# Patient Record
Sex: Female | Born: 1960 | Race: White | Hispanic: No | Marital: Married
Health system: Southern US, Community
[De-identification: ages and names within clinical notes are randomized; demographics above are authoritative.]

## PROBLEM LIST (undated history)

## (undated) DIAGNOSIS — I10 Essential (primary) hypertension: Secondary | ICD-10-CM

## (undated) DIAGNOSIS — J45909 Unspecified asthma, uncomplicated: Secondary | ICD-10-CM

## (undated) DIAGNOSIS — E785 Hyperlipidemia, unspecified: Secondary | ICD-10-CM

---

## 1998-09-17 ENCOUNTER — Other Ambulatory Visit: Admission: RE | Admit: 1998-09-17 | Discharge: 1998-09-17 | Payer: Self-pay | Admitting: Family Medicine

## 1998-10-21 ENCOUNTER — Encounter: Admission: RE | Admit: 1998-10-21 | Discharge: 1998-10-21 | Payer: Self-pay | Admitting: Family Medicine

## 1999-12-14 ENCOUNTER — Other Ambulatory Visit: Admission: RE | Admit: 1999-12-14 | Discharge: 1999-12-14 | Payer: Self-pay | Admitting: Family Medicine

## 1999-12-24 ENCOUNTER — Encounter: Payer: Self-pay | Admitting: Family Medicine

## 1999-12-24 ENCOUNTER — Encounter: Admission: RE | Admit: 1999-12-24 | Discharge: 1999-12-24 | Payer: Self-pay | Admitting: Family Medicine

## 2001-02-26 ENCOUNTER — Other Ambulatory Visit: Admission: RE | Admit: 2001-02-26 | Discharge: 2001-02-26 | Payer: Self-pay | Admitting: Family Medicine

## 2001-03-01 ENCOUNTER — Encounter: Payer: Self-pay | Admitting: Family Medicine

## 2001-03-01 ENCOUNTER — Encounter: Admission: RE | Admit: 2001-03-01 | Discharge: 2001-03-01 | Payer: Self-pay | Admitting: Family Medicine

## 2002-05-23 ENCOUNTER — Other Ambulatory Visit: Admission: RE | Admit: 2002-05-23 | Discharge: 2002-05-23 | Payer: Self-pay | Admitting: Family Medicine

## 2002-08-12 ENCOUNTER — Encounter: Admission: RE | Admit: 2002-08-12 | Discharge: 2002-08-12 | Payer: Self-pay | Admitting: Family Medicine

## 2002-08-12 ENCOUNTER — Encounter: Payer: Self-pay | Admitting: Family Medicine

## 2003-08-25 ENCOUNTER — Encounter: Admission: RE | Admit: 2003-08-25 | Discharge: 2003-08-25 | Payer: Self-pay | Admitting: Family Medicine

## 2003-10-28 ENCOUNTER — Other Ambulatory Visit: Admission: RE | Admit: 2003-10-28 | Discharge: 2003-10-28 | Payer: Self-pay | Admitting: Family Medicine

## 2004-05-03 ENCOUNTER — Ambulatory Visit: Payer: Self-pay | Admitting: Internal Medicine

## 2004-05-10 ENCOUNTER — Encounter: Admission: RE | Admit: 2004-05-10 | Discharge: 2004-05-10 | Payer: Self-pay | Admitting: Family Medicine

## 2004-09-24 ENCOUNTER — Encounter: Admission: RE | Admit: 2004-09-24 | Discharge: 2004-09-24 | Payer: Self-pay | Admitting: Family Medicine

## 2004-11-03 ENCOUNTER — Other Ambulatory Visit: Admission: RE | Admit: 2004-11-03 | Discharge: 2004-11-03 | Payer: Self-pay | Admitting: Family Medicine

## 2005-10-10 ENCOUNTER — Encounter: Admission: RE | Admit: 2005-10-10 | Discharge: 2005-10-10 | Payer: Self-pay | Admitting: Family Medicine

## 2005-11-04 ENCOUNTER — Other Ambulatory Visit: Admission: RE | Admit: 2005-11-04 | Discharge: 2005-11-04 | Payer: Self-pay | Admitting: Family Medicine

## 2005-11-08 ENCOUNTER — Encounter: Admission: RE | Admit: 2005-11-08 | Discharge: 2005-11-08 | Payer: Self-pay | Admitting: Family Medicine

## 2005-11-16 ENCOUNTER — Encounter: Admission: RE | Admit: 2005-11-16 | Discharge: 2005-11-16 | Payer: Self-pay | Admitting: Family Medicine

## 2006-11-08 ENCOUNTER — Other Ambulatory Visit: Admission: RE | Admit: 2006-11-08 | Discharge: 2006-11-08 | Payer: Self-pay | Admitting: Family Medicine

## 2006-11-22 ENCOUNTER — Encounter: Admission: RE | Admit: 2006-11-22 | Discharge: 2006-11-22 | Payer: Self-pay | Admitting: Family Medicine

## 2006-11-29 ENCOUNTER — Encounter: Admission: RE | Admit: 2006-11-29 | Discharge: 2006-11-29 | Payer: Self-pay | Admitting: Family Medicine

## 2007-05-11 ENCOUNTER — Encounter: Admission: RE | Admit: 2007-05-11 | Discharge: 2007-05-11 | Payer: Self-pay | Admitting: Family Medicine

## 2007-12-03 ENCOUNTER — Encounter: Admission: RE | Admit: 2007-12-03 | Discharge: 2007-12-03 | Payer: Self-pay | Admitting: Family Medicine

## 2007-12-18 ENCOUNTER — Other Ambulatory Visit: Admission: RE | Admit: 2007-12-18 | Discharge: 2007-12-18 | Payer: Self-pay | Admitting: Family Medicine

## 2008-12-03 ENCOUNTER — Encounter: Admission: RE | Admit: 2008-12-03 | Discharge: 2008-12-03 | Payer: Self-pay | Admitting: Family Medicine

## 2009-01-28 ENCOUNTER — Other Ambulatory Visit: Admission: RE | Admit: 2009-01-28 | Discharge: 2009-01-28 | Payer: Self-pay | Admitting: Family Medicine

## 2009-02-13 ENCOUNTER — Encounter: Admission: RE | Admit: 2009-02-13 | Discharge: 2009-02-13 | Payer: Self-pay | Admitting: Gastroenterology

## 2009-12-21 ENCOUNTER — Encounter
Admission: RE | Admit: 2009-12-21 | Discharge: 2009-12-21 | Payer: Self-pay | Source: Home / Self Care | Attending: Family Medicine | Admitting: Family Medicine

## 2010-03-01 ENCOUNTER — Other Ambulatory Visit: Payer: Self-pay | Admitting: Family Medicine

## 2010-03-01 ENCOUNTER — Other Ambulatory Visit (HOSPITAL_COMMUNITY)
Admission: RE | Admit: 2010-03-01 | Discharge: 2010-03-01 | Disposition: A | Discharge status: Home / Self Care | Payer: 59 | Source: Ambulatory Visit | Attending: Family Medicine | Admitting: Family Medicine

## 2010-03-01 DIAGNOSIS — Z124 Encounter for screening for malignant neoplasm of cervix: Secondary | ICD-10-CM | POA: Insufficient documentation

## 2010-12-22 ENCOUNTER — Other Ambulatory Visit: Payer: Self-pay | Admitting: Family Medicine

## 2010-12-22 DIAGNOSIS — Z1231 Encounter for screening mammogram for malignant neoplasm of breast: Secondary | ICD-10-CM

## 2010-12-31 ENCOUNTER — Ambulatory Visit: Payer: 59

## 2011-02-16 ENCOUNTER — Ambulatory Visit
Admission: RE | Admit: 2011-02-16 | Discharge: 2011-02-16 | Disposition: A | Discharge status: Home / Self Care | Payer: 59 | Source: Ambulatory Visit | Attending: Family Medicine | Admitting: Family Medicine

## 2011-02-16 DIAGNOSIS — Z1231 Encounter for screening mammogram for malignant neoplasm of breast: Secondary | ICD-10-CM

## 2011-10-06 ENCOUNTER — Other Ambulatory Visit: Payer: Self-pay | Admitting: Family Medicine

## 2011-10-06 DIAGNOSIS — M79609 Pain in unspecified limb: Secondary | ICD-10-CM

## 2011-10-15 ENCOUNTER — Other Ambulatory Visit: Payer: 59

## 2011-10-29 ENCOUNTER — Ambulatory Visit
Admission: RE | Admit: 2011-10-29 | Discharge: 2011-10-29 | Disposition: A | Discharge status: Home / Self Care | Payer: 59 | Source: Ambulatory Visit | Attending: Family Medicine | Admitting: Family Medicine

## 2011-10-29 DIAGNOSIS — M79609 Pain in unspecified limb: Secondary | ICD-10-CM

## 2012-01-25 ENCOUNTER — Other Ambulatory Visit: Payer: Self-pay | Admitting: Family Medicine

## 2012-01-25 DIAGNOSIS — Z1231 Encounter for screening mammogram for malignant neoplasm of breast: Secondary | ICD-10-CM

## 2012-02-22 ENCOUNTER — Ambulatory Visit: Payer: 59

## 2012-03-28 ENCOUNTER — Ambulatory Visit
Admission: RE | Admit: 2012-03-28 | Discharge: 2012-03-28 | Disposition: A | Discharge status: Home / Self Care | Payer: 59 | Source: Ambulatory Visit | Attending: Family Medicine | Admitting: Family Medicine

## 2012-03-28 DIAGNOSIS — Z1231 Encounter for screening mammogram for malignant neoplasm of breast: Secondary | ICD-10-CM

## 2013-02-19 ENCOUNTER — Other Ambulatory Visit: Payer: Self-pay

## 2013-02-19 DIAGNOSIS — Z1231 Encounter for screening mammogram for malignant neoplasm of breast: Secondary | ICD-10-CM

## 2013-03-29 ENCOUNTER — Ambulatory Visit: Payer: 59

## 2013-04-09 ENCOUNTER — Ambulatory Visit: Payer: 59

## 2013-04-24 ENCOUNTER — Ambulatory Visit
Admission: RE | Admit: 2013-04-24 | Discharge: 2013-04-24 | Disposition: A | Discharge status: Home / Self Care | Payer: 59 | Source: Ambulatory Visit

## 2013-04-24 DIAGNOSIS — Z1231 Encounter for screening mammogram for malignant neoplasm of breast: Secondary | ICD-10-CM

## 2013-06-06 ENCOUNTER — Other Ambulatory Visit: Payer: Self-pay | Admitting: Family Medicine

## 2013-06-06 ENCOUNTER — Other Ambulatory Visit (HOSPITAL_COMMUNITY)
Admission: RE | Admit: 2013-06-06 | Discharge: 2013-06-06 | Disposition: A | Discharge status: Home / Self Care | Payer: 59 | Source: Ambulatory Visit | Attending: Family Medicine | Admitting: Family Medicine

## 2013-06-06 DIAGNOSIS — Z01419 Encounter for gynecological examination (general) (routine) without abnormal findings: Secondary | ICD-10-CM | POA: Insufficient documentation

## 2014-11-05 ENCOUNTER — Other Ambulatory Visit: Payer: Self-pay

## 2014-11-05 DIAGNOSIS — Z1231 Encounter for screening mammogram for malignant neoplasm of breast: Secondary | ICD-10-CM

## 2014-12-03 ENCOUNTER — Ambulatory Visit
Admission: RE | Admit: 2014-12-03 | Discharge: 2014-12-03 | Disposition: A | Discharge status: Home / Self Care | Payer: 59 | Source: Ambulatory Visit

## 2014-12-03 DIAGNOSIS — Z1231 Encounter for screening mammogram for malignant neoplasm of breast: Secondary | ICD-10-CM

## 2016-04-06 DIAGNOSIS — E7800 Pure hypercholesterolemia, unspecified: Secondary | ICD-10-CM | POA: Diagnosis not present

## 2016-04-06 DIAGNOSIS — I10 Essential (primary) hypertension: Secondary | ICD-10-CM | POA: Diagnosis not present

## 2016-04-06 DIAGNOSIS — R1013 Epigastric pain: Secondary | ICD-10-CM | POA: Diagnosis not present

## 2016-04-27 DIAGNOSIS — M25561 Pain in right knee: Secondary | ICD-10-CM | POA: Diagnosis not present

## 2016-05-05 DIAGNOSIS — M79604 Pain in right leg: Secondary | ICD-10-CM | POA: Diagnosis not present

## 2016-05-17 DIAGNOSIS — M79604 Pain in right leg: Secondary | ICD-10-CM | POA: Diagnosis not present

## 2016-05-25 DIAGNOSIS — M79604 Pain in right leg: Secondary | ICD-10-CM | POA: Diagnosis not present

## 2016-06-01 DIAGNOSIS — M79604 Pain in right leg: Secondary | ICD-10-CM | POA: Diagnosis not present

## 2016-11-03 ENCOUNTER — Other Ambulatory Visit: Payer: Self-pay | Admitting: Family Medicine

## 2016-11-03 ENCOUNTER — Other Ambulatory Visit (HOSPITAL_COMMUNITY)
Admission: RE | Admit: 2016-11-03 | Discharge: 2016-11-03 | Disposition: A | Discharge status: Home / Self Care | Payer: 59 | Source: Ambulatory Visit | Attending: Family Medicine | Admitting: Family Medicine

## 2016-11-03 DIAGNOSIS — Z0000 Encounter for general adult medical examination without abnormal findings: Secondary | ICD-10-CM | POA: Diagnosis not present

## 2016-11-03 DIAGNOSIS — I10 Essential (primary) hypertension: Secondary | ICD-10-CM | POA: Diagnosis not present

## 2016-11-03 DIAGNOSIS — E7800 Pure hypercholesterolemia, unspecified: Secondary | ICD-10-CM | POA: Diagnosis not present

## 2016-11-03 DIAGNOSIS — Z01419 Encounter for gynecological examination (general) (routine) without abnormal findings: Secondary | ICD-10-CM | POA: Insufficient documentation

## 2016-11-04 ENCOUNTER — Other Ambulatory Visit: Payer: Self-pay | Admitting: Family Medicine

## 2016-11-04 DIAGNOSIS — Z1231 Encounter for screening mammogram for malignant neoplasm of breast: Secondary | ICD-10-CM

## 2016-11-04 LAB — CYTOLOGY - PAP: Diagnosis: NEGATIVE

## 2016-11-29 ENCOUNTER — Ambulatory Visit
Admission: RE | Admit: 2016-11-29 | Discharge: 2016-11-29 | Disposition: A | Discharge status: Home / Self Care | Payer: 59 | Source: Ambulatory Visit | Attending: Family Medicine | Admitting: Family Medicine

## 2016-11-29 DIAGNOSIS — Z1231 Encounter for screening mammogram for malignant neoplasm of breast: Secondary | ICD-10-CM | POA: Diagnosis not present

## 2017-05-31 DIAGNOSIS — E7800 Pure hypercholesterolemia, unspecified: Secondary | ICD-10-CM | POA: Diagnosis not present

## 2017-05-31 DIAGNOSIS — I10 Essential (primary) hypertension: Secondary | ICD-10-CM | POA: Diagnosis not present

## 2017-05-31 DIAGNOSIS — K219 Gastro-esophageal reflux disease without esophagitis: Secondary | ICD-10-CM | POA: Diagnosis not present

## 2017-08-14 DIAGNOSIS — R42 Dizziness and giddiness: Secondary | ICD-10-CM | POA: Diagnosis not present

## 2017-10-03 DIAGNOSIS — R42 Dizziness and giddiness: Secondary | ICD-10-CM | POA: Diagnosis not present

## 2017-10-11 DIAGNOSIS — R42 Dizziness and giddiness: Secondary | ICD-10-CM | POA: Diagnosis not present

## 2017-10-18 ENCOUNTER — Other Ambulatory Visit: Payer: Self-pay | Admitting: Family Medicine

## 2017-10-18 DIAGNOSIS — Z1231 Encounter for screening mammogram for malignant neoplasm of breast: Secondary | ICD-10-CM

## 2017-12-04 ENCOUNTER — Ambulatory Visit
Admission: RE | Admit: 2017-12-04 | Discharge: 2017-12-04 | Disposition: A | Discharge status: Home / Self Care | Payer: 59 | Source: Ambulatory Visit | Attending: Family Medicine | Admitting: Family Medicine

## 2017-12-04 DIAGNOSIS — Z1231 Encounter for screening mammogram for malignant neoplasm of breast: Secondary | ICD-10-CM

## 2017-12-15 DIAGNOSIS — Z0000 Encounter for general adult medical examination without abnormal findings: Secondary | ICD-10-CM | POA: Diagnosis not present

## 2017-12-15 DIAGNOSIS — I10 Essential (primary) hypertension: Secondary | ICD-10-CM | POA: Diagnosis not present

## 2017-12-15 DIAGNOSIS — E7800 Pure hypercholesterolemia, unspecified: Secondary | ICD-10-CM | POA: Diagnosis not present

## 2017-12-29 DIAGNOSIS — S46811A Strain of other muscles, fascia and tendons at shoulder and upper arm level, right arm, initial encounter: Secondary | ICD-10-CM | POA: Diagnosis not present

## 2018-01-09 DIAGNOSIS — S46911D Strain of unspecified muscle, fascia and tendon at shoulder and upper arm level, right arm, subsequent encounter: Secondary | ICD-10-CM | POA: Diagnosis not present

## 2018-01-09 DIAGNOSIS — M6281 Muscle weakness (generalized): Secondary | ICD-10-CM | POA: Diagnosis not present

## 2019-01-15 ENCOUNTER — Other Ambulatory Visit: Payer: Self-pay | Admitting: Family Medicine

## 2019-01-15 DIAGNOSIS — Z1231 Encounter for screening mammogram for malignant neoplasm of breast: Secondary | ICD-10-CM

## 2019-01-22 ENCOUNTER — Other Ambulatory Visit: Payer: Self-pay

## 2019-01-22 ENCOUNTER — Ambulatory Visit
Admission: RE | Admit: 2019-01-22 | Discharge: 2019-01-22 | Disposition: A | Discharge status: Home / Self Care | Payer: BLUE CROSS/BLUE SHIELD | Source: Ambulatory Visit | Attending: Family Medicine | Admitting: Family Medicine

## 2019-01-22 DIAGNOSIS — Z1231 Encounter for screening mammogram for malignant neoplasm of breast: Secondary | ICD-10-CM | POA: Diagnosis not present

## 2019-04-28 DIAGNOSIS — Z03818 Encounter for observation for suspected exposure to other biological agents ruled out: Secondary | ICD-10-CM | POA: Diagnosis not present

## 2019-06-21 DIAGNOSIS — Z0000 Encounter for general adult medical examination without abnormal findings: Secondary | ICD-10-CM | POA: Diagnosis not present

## 2019-06-21 DIAGNOSIS — E7800 Pure hypercholesterolemia, unspecified: Secondary | ICD-10-CM | POA: Diagnosis not present

## 2019-06-21 DIAGNOSIS — Z23 Encounter for immunization: Secondary | ICD-10-CM | POA: Diagnosis not present

## 2019-06-21 DIAGNOSIS — I10 Essential (primary) hypertension: Secondary | ICD-10-CM | POA: Diagnosis not present

## 2019-08-22 DIAGNOSIS — Z23 Encounter for immunization: Secondary | ICD-10-CM | POA: Diagnosis not present

## 2020-01-17 ENCOUNTER — Other Ambulatory Visit: Payer: Self-pay | Admitting: Family Medicine

## 2020-01-17 DIAGNOSIS — Z1231 Encounter for screening mammogram for malignant neoplasm of breast: Secondary | ICD-10-CM

## 2020-02-27 ENCOUNTER — Ambulatory Visit
Admission: RE | Admit: 2020-02-27 | Discharge: 2020-02-27 | Disposition: A | Discharge status: Home / Self Care | Payer: BLUE CROSS/BLUE SHIELD | Source: Ambulatory Visit | Attending: Family Medicine | Admitting: Family Medicine

## 2020-02-27 ENCOUNTER — Other Ambulatory Visit: Payer: Self-pay

## 2020-02-27 DIAGNOSIS — Z1231 Encounter for screening mammogram for malignant neoplasm of breast: Secondary | ICD-10-CM | POA: Diagnosis not present

## 2020-06-22 DIAGNOSIS — Z124 Encounter for screening for malignant neoplasm of cervix: Secondary | ICD-10-CM | POA: Diagnosis not present

## 2020-06-22 DIAGNOSIS — Z0000 Encounter for general adult medical examination without abnormal findings: Secondary | ICD-10-CM | POA: Diagnosis not present

## 2020-06-22 DIAGNOSIS — I10 Essential (primary) hypertension: Secondary | ICD-10-CM | POA: Diagnosis not present

## 2020-06-22 DIAGNOSIS — E7800 Pure hypercholesterolemia, unspecified: Secondary | ICD-10-CM | POA: Diagnosis not present

## 2020-10-24 DIAGNOSIS — Z23 Encounter for immunization: Secondary | ICD-10-CM | POA: Diagnosis not present

## 2021-02-04 ENCOUNTER — Other Ambulatory Visit: Payer: Self-pay | Admitting: Family Medicine

## 2021-02-04 DIAGNOSIS — Z1231 Encounter for screening mammogram for malignant neoplasm of breast: Secondary | ICD-10-CM

## 2021-03-01 ENCOUNTER — Other Ambulatory Visit: Payer: Self-pay

## 2021-03-01 ENCOUNTER — Ambulatory Visit
Admission: RE | Admit: 2021-03-01 | Discharge: 2021-03-01 | Disposition: A | Discharge status: Home / Self Care | Payer: BLUE CROSS/BLUE SHIELD | Source: Ambulatory Visit | Attending: Family Medicine | Admitting: Family Medicine

## 2021-03-01 DIAGNOSIS — Z1231 Encounter for screening mammogram for malignant neoplasm of breast: Secondary | ICD-10-CM | POA: Diagnosis not present

## 2021-04-14 DIAGNOSIS — L814 Other melanin hyperpigmentation: Secondary | ICD-10-CM | POA: Diagnosis not present

## 2021-04-14 DIAGNOSIS — D2239 Melanocytic nevi of other parts of face: Secondary | ICD-10-CM | POA: Diagnosis not present

## 2021-04-14 DIAGNOSIS — L570 Actinic keratosis: Secondary | ICD-10-CM | POA: Diagnosis not present

## 2021-04-14 DIAGNOSIS — D225 Melanocytic nevi of trunk: Secondary | ICD-10-CM | POA: Diagnosis not present

## 2021-04-14 DIAGNOSIS — L820 Inflamed seborrheic keratosis: Secondary | ICD-10-CM | POA: Diagnosis not present

## 2021-06-03 ENCOUNTER — Other Ambulatory Visit: Payer: Self-pay

## 2021-06-03 ENCOUNTER — Emergency Department (HOSPITAL_BASED_OUTPATIENT_CLINIC_OR_DEPARTMENT_OTHER)
Admission: EM | Admit: 2021-06-03 | Discharge: 2021-06-03 | Disposition: A | Discharge status: Home / Self Care | Payer: BLUE CROSS/BLUE SHIELD | Attending: Emergency Medicine | Admitting: Emergency Medicine

## 2021-06-03 ENCOUNTER — Encounter (HOSPITAL_BASED_OUTPATIENT_CLINIC_OR_DEPARTMENT_OTHER): Payer: Self-pay | Admitting: Emergency Medicine

## 2021-06-03 ENCOUNTER — Emergency Department (HOSPITAL_BASED_OUTPATIENT_CLINIC_OR_DEPARTMENT_OTHER): Payer: BLUE CROSS/BLUE SHIELD

## 2021-06-03 DIAGNOSIS — R040 Epistaxis: Secondary | ICD-10-CM | POA: Diagnosis not present

## 2021-06-03 DIAGNOSIS — J45909 Unspecified asthma, uncomplicated: Secondary | ICD-10-CM | POA: Insufficient documentation

## 2021-06-03 DIAGNOSIS — R030 Elevated blood-pressure reading, without diagnosis of hypertension: Secondary | ICD-10-CM | POA: Diagnosis not present

## 2021-06-03 DIAGNOSIS — I10 Essential (primary) hypertension: Secondary | ICD-10-CM | POA: Insufficient documentation

## 2021-06-03 DIAGNOSIS — R42 Dizziness and giddiness: Secondary | ICD-10-CM | POA: Diagnosis not present

## 2021-06-03 HISTORY — DX: Hyperlipidemia, unspecified: E78.5

## 2021-06-03 HISTORY — DX: Unspecified asthma, uncomplicated: J45.909

## 2021-06-03 HISTORY — DX: Essential (primary) hypertension: I10

## 2021-06-03 LAB — CBC
HCT: 42.9 % (ref 36.0–46.0)
Hemoglobin: 14.5 g/dL (ref 12.0–15.0)
MCH: 32.2 pg (ref 26.0–34.0)
MCHC: 33.8 g/dL (ref 30.0–36.0)
MCV: 95.1 fL (ref 80.0–100.0)
Platelets: 341 10*3/uL (ref 150–400)
RBC: 4.51 MIL/uL (ref 3.87–5.11)
RDW: 13.1 % (ref 11.5–15.5)
WBC: 10.7 10*3/uL — ABNORMAL HIGH (ref 4.0–10.5)
nRBC: 0 % (ref 0.0–0.2)

## 2021-06-03 LAB — BASIC METABOLIC PANEL
Anion gap: 8 (ref 5–15)
BUN: 13 mg/dL (ref 8–23)
CO2: 27 mmol/L (ref 22–32)
Calcium: 9.1 mg/dL (ref 8.9–10.3)
Chloride: 104 mmol/L (ref 98–111)
Creatinine, Ser: 0.8 mg/dL (ref 0.44–1.00)
GFR, Estimated: >60 mL/min (ref >60)
Glucose, Bld: 109 mg/dL — ABNORMAL HIGH (ref 70–99)
Potassium: 3.5 mmol/L (ref 3.5–5.1)
Sodium: 139 mmol/L (ref 135–145)

## 2021-06-03 LAB — URINALYSIS, MICROSCOPIC (REFLEX): WBC, UA: NONE SEEN WBC/hpf (ref 0–5)

## 2021-06-03 LAB — URINALYSIS, ROUTINE W REFLEX MICROSCOPIC
Bilirubin Urine: NEGATIVE
Glucose, UA: NEGATIVE mg/dL
Ketones, ur: NEGATIVE mg/dL
Leukocytes,Ua: NEGATIVE
Nitrite: NEGATIVE
Protein, ur: NEGATIVE mg/dL
Specific Gravity, Urine: 1.015 (ref 1.005–1.030)
pH: 7 (ref 5.0–8.0)

## 2021-06-03 LAB — CBG MONITORING, ED: Glucose-Capillary: 108 mg/dL — ABNORMAL HIGH (ref 70–99)

## 2021-06-03 NOTE — Discharge Instructions (Signed)
We discussed, we recommend keeping your nostrils moisturized with Vaseline.  You can do this 1-2 times per day.  If you find that you have a recurrent nosebleed, please use the Afrin nasal spray and sterile gauze like you were instructed.  Below is the contact information for [facility]. If you find that you are continuing to get recurrent nosebleeds, please give them a call and schedule and appointment for reevaluation.  If you develop any new or worsening symptoms, please come back to the emergency department. It was a pleasure to meet you.

## 2021-06-03 NOTE — ED Provider Notes (Signed)
[facility] EMERGENCY DEPARTMENT Provider Note   CSN: [id number] Arrival date & time: 06/03/21  1439     History  Chief Complaint  Patient presents with   Epistaxis    Monica Hayes is a 61 y.o. female.  HPI Patient is a 61 year old female with hypertension, hyperlipidemia, asthma who presents to the emergency department due to recurrent nosebleeds.  Patient states that she was at the beach about 1.5 weeks ago.  Since coming home from the beach she began having nosebleeds.  She states that they have occurred about 4 times in the past week.  Most recent was this morning.  They last about 5 to 20 minutes and then will spontaneously resolve.  She does note that she has a history of vertigo and at times becomes dizzy.  She has noticed that after having the nosebleed she will have a brief episode of feeling "off balance" but denies any persistent dizziness or symptoms similar to the "room spinning".  Denies any chest pain, shortness of breath, visual changes, syncope.  She takes 100 mg of losartan for her high blood pressure and has been compliant with this medication.  She has noticed that her blood pressure has been higher than normal over the past week.  She does note that while at the beach she was eating more fried foods and drinking more alcohol than normal.    Home Medications Prior to Admission medications   Not on File      Allergies    Atorvastatin, Penicillins, Rosuvastatin, and Sulfa antibiotics    Review of Systems   Review of Systems  All other systems reviewed and are negative. Ten systems reviewed and are negative for acute change, except as noted in the HPI.   Physical Exam Updated Vital Signs BP (!) 178/86   Pulse 93   Temp 98.1 F (36.7 C) (Oral)   Resp 18   Ht 4' 10.5" (1.486 m)   Wt 63.5 kg   SpO2 97%   BMI 28.76 kg/m  Physical Exam Vitals and nursing note reviewed.  Constitutional:      General: She is not in acute distress.    Appearance:  Normal appearance. She is not ill-appearing, toxic-appearing or diaphoretic.  HENT:     Head: Normocephalic and atraumatic.     Right Ear: External ear normal.     Left Ear: External ear normal.     Nose: Nose normal.     Comments: No bleeding noted from the nares.     Mouth/Throat:     Mouth: Mucous membranes are moist.     Pharynx: Oropharynx is clear. No oropharyngeal exudate or posterior oropharyngeal erythema.  Eyes:     General: No scleral icterus.       Right eye: No discharge.        Left eye: No discharge.     Extraocular Movements: Extraocular movements intact.     Conjunctiva/sclera: Conjunctivae normal.     Pupils: Pupils are equal, round, and reactive to light.     Comments: PERRL. EOMI.  Cardiovascular:     Rate and Rhythm: Normal rate and regular rhythm.     Pulses: Normal pulses.     Heart sounds: Normal heart sounds. No murmur heard.   No friction rub. No gallop.  Pulmonary:     Effort: Pulmonary effort is normal. No respiratory distress.     Breath sounds: Normal breath sounds. No stridor. No wheezing, rhonchi or rales.  Abdominal:  General: Abdomen is flat.     Palpations: Abdomen is soft.     Tenderness: There is no abdominal tenderness.  Musculoskeletal:        General: Normal range of motion.     Cervical back: Normal range of motion and neck supple. No tenderness.     Right lower leg: No edema.     Left lower leg: No edema.  Skin:    General: Skin is warm and dry.  Neurological:     General: No focal deficit present.     Mental Status: She is alert and oriented to person, place, and time.     Comments: Patient is oriented to person, place, and time. Patient phonates in clear, complete, and coherent sentences.  No facial droop.  Strength is 5/5 in all four extremities. Distal sensation intact in all four extremities.  Psychiatric:        Mood and Affect: Mood normal.        Behavior: Behavior normal.   ED Results / Procedures / Treatments    Labs (all labs ordered are listed, but only abnormal results are displayed) Labs Reviewed  BASIC METABOLIC PANEL - Abnormal; Notable for the following components:      Result Value   Glucose, Bld 109 (*)    All other components within normal limits  CBC - Abnormal; Notable for the following components:   WBC 10.7 (*)    All other components within normal limits  URINALYSIS, ROUTINE W REFLEX MICROSCOPIC - Abnormal; Notable for the following components:   Hgb urine dipstick TRACE (*)    All other components within normal limits  URINALYSIS, MICROSCOPIC (REFLEX) - Abnormal; Notable for the following components:   Bacteria, UA RARE (*)    All other components within normal limits  CBG MONITORING, ED - Abnormal; Notable for the following components:   Glucose-Capillary 108 (*)    All other components within normal limits   EKG EKG Interpretation  Date/Time:  Thursday Jun 03 2021 14:47:55 EDT Ventricular Rate:  87 PR Interval:  132 QRS Duration: 78 QT Interval:  370 QTC Calculation: 445 R Axis:   13 Text Interpretation: Normal sinus rhythm Cannot rule out Anterior infarct , age undetermined Abnormal ECG No previous ECGs available no prior ECG for comparison NO sTEMI Confirmed by Antony Blackbird [phone]) on 06/03/2021 5:18:48 PM  Radiology CT Head Wo Contrast  Result Date: 06/03/2021 CLINICAL DATA:  Dizziness Simona Huh, nonspecific. EXAM: CT HEAD WITHOUT CONTRAST TECHNIQUE: Contiguous axial images were obtained from the base of the skull through the vertex without intravenous contrast. RADIATION DOSE REDUCTION: This exam was performed according to the departmental dose-optimization program which includes automated exposure control, adjustment of the mA and/or kV according to patient size and/or use of iterative reconstruction technique. COMPARISON:  None FINDINGS: Brain: The brain shows a normal appearance without evidence of malformation, atrophy, old or acute small or large vessel  infarction, mass lesion, hemorrhage, hydrocephalus or extra-axial collection. Vascular: No hyperdense vessel. No evidence of atherosclerotic calcification. Skull: Normal.  No traumatic finding.  No focal bone lesion. Sinuses/Orbits: Sinuses are clear. Orbits appear normal. Mastoids are clear. Other: None significant IMPRESSION: Normal head CT. Electronically Signed   By: Nelson Chimes M.D.   On: 06/03/2021 16:19    Procedures Procedures   Medications Ordered in ED Medications - No data to display  ED Course/ Medical Decision Making/ A&P  Medical Decision Making Amount and/or Complexity of Data Reviewed Labs: ordered. Radiology: ordered.  Pt is a 61 y.o. female who presents to the emergency department due to 4 episodes of epistaxis that occurred over the past week.  She notes that she recently got back from the beach prior to the onset of these episodes.  Patient also notes elevated blood pressure over the past week.  She states that she was also eating a large amount of salty food and drinking more alcohol than normal while at the beach prior to the onset of her elevated blood pressure.  Labs: CBC with a white count of 10.7. BMP with a glucose of 109. UA with trace hemoglobin and rare bacteria.  Imaging: CT scan of the head without contrast shows a "normal head CT."  I, Rayna Sexton, PA-C, personally reviewed and evaluated these images and lab results as part of my medical decision-making.  Patient's physical exam appears reassuring.  No active bleeding noted from the nares.  Heart is regular rate and rhythm without murmurs, rubs, or gallops.  Lungs are clear to auscultation bilaterally.  Neurological exam appears benign at this time.  Patient did note that she has a history of vertigo and after her nosebleeds she has had brief episodes of "feeling off balance".  Denies any sensations of the room spinning.  I obtained a CT scan of her head which shows a "normal  head CT".  She also notes that her blood pressures been more elevated than normal over the past week.  She has been compliant with her losartan.  Current blood pressure is 178/86.  Denies any chest pain, shortness of breath, vision changes, numbness, weakness.  Lab work appears reassuring.  Normal kidney function.  No proteinuria.  No signs of endorgan damage noted.  Doubt hypertensive emergency at this time. Recommended that she continue to monitor her BP at home and follow up with her PCP regarding this.  Patient lying comfortably in bed.  She appears stable for discharge at this time and she is agreeable.  Recommended Afrin use as needed if she finds that she has another episode of epistaxis.  Patient was given a referral to ENT.  She understands to return to the emergency department if she finds her symptoms are refractory.  Her questions were answered and she was amicable at the time of discharge.  Note: Portions of this report may have been transcribed using voice recognition software. Every effort was made to ensure accuracy; however, inadvertent computerized transcription errors may be present.   Final Clinical Impression(s) / ED Diagnoses Final diagnoses:  Epistaxis   Rx / DC Orders ED Discharge Orders     None         Rayna Sexton, PA-C 06/03/21 1800    Tegeler, Gwenyth Allegra, MD 06/03/21 2311

## 2021-06-03 NOTE — ED Triage Notes (Signed)
Intermittent epistaxis x 1 week , Hx HTN, no blood thinner .  Sleeps with Ac and Edison Simon on .  Reports dull headache with epistaxis.  Dizziness this am , intermittent dizziness x 2 weeks .  Ambulatory , denies further neuro deficit

## 2021-06-08 DIAGNOSIS — I10 Essential (primary) hypertension: Secondary | ICD-10-CM | POA: Diagnosis not present

## 2021-06-08 DIAGNOSIS — R040 Epistaxis: Secondary | ICD-10-CM | POA: Diagnosis not present

## 2021-06-24 DIAGNOSIS — I10 Essential (primary) hypertension: Secondary | ICD-10-CM | POA: Diagnosis not present

## 2021-06-24 DIAGNOSIS — Z0000 Encounter for general adult medical examination without abnormal findings: Secondary | ICD-10-CM | POA: Diagnosis not present

## 2021-06-24 DIAGNOSIS — E7800 Pure hypercholesterolemia, unspecified: Secondary | ICD-10-CM | POA: Diagnosis not present

## 2021-06-25 DIAGNOSIS — J310 Chronic rhinitis: Secondary | ICD-10-CM | POA: Diagnosis not present

## 2021-06-25 DIAGNOSIS — Z87898 Personal history of other specified conditions: Secondary | ICD-10-CM | POA: Diagnosis not present

## 2021-06-25 DIAGNOSIS — J342 Deviated nasal septum: Secondary | ICD-10-CM | POA: Diagnosis not present

## 2022-02-18 ENCOUNTER — Other Ambulatory Visit: Payer: Self-pay | Admitting: Family Medicine

## 2022-02-18 DIAGNOSIS — Z1231 Encounter for screening mammogram for malignant neoplasm of breast: Secondary | ICD-10-CM

## 2022-04-07 ENCOUNTER — Ambulatory Visit
Admission: RE | Admit: 2022-04-07 | Discharge: 2022-04-07 | Disposition: A | Discharge status: Home / Self Care | Payer: BLUE CROSS/BLUE SHIELD | Source: Ambulatory Visit | Attending: Family Medicine | Admitting: Family Medicine

## 2022-04-07 DIAGNOSIS — Z1231 Encounter for screening mammogram for malignant neoplasm of breast: Secondary | ICD-10-CM

## 2022-06-27 DIAGNOSIS — E7800 Pure hypercholesterolemia, unspecified: Secondary | ICD-10-CM | POA: Diagnosis not present

## 2022-06-27 DIAGNOSIS — Z0000 Encounter for general adult medical examination without abnormal findings: Secondary | ICD-10-CM | POA: Diagnosis not present

## 2022-06-27 DIAGNOSIS — I10 Essential (primary) hypertension: Secondary | ICD-10-CM | POA: Diagnosis not present

## 2022-11-04 DIAGNOSIS — R002 Palpitations: Secondary | ICD-10-CM | POA: Diagnosis not present

## 2022-11-28 DIAGNOSIS — R002 Palpitations: Secondary | ICD-10-CM | POA: Diagnosis not present

## 2023-04-04 IMAGING — CT CT HEAD W/O CM
3 series · 14 of 47 positions shown, 16 images · non-contrast
Comparison: None

CLINICAL DATA: Dizziness Aujla, nonspecific.



[Series 2: head wo · axial · 0.44mm/px · z∈[+627,+752]mm · 8 of 30 slices shown, 10 images]
[im 3/30  brain]
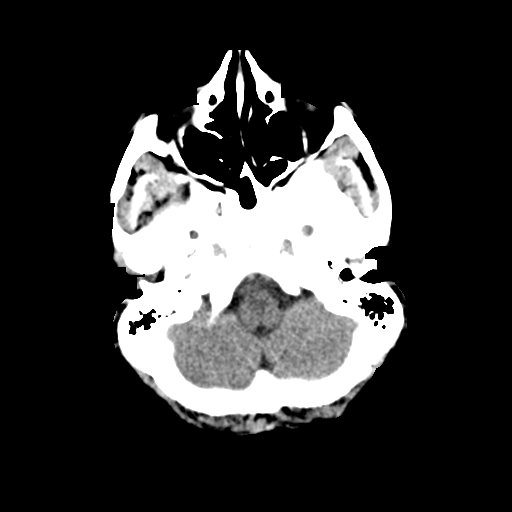
[im 3/30  bone]
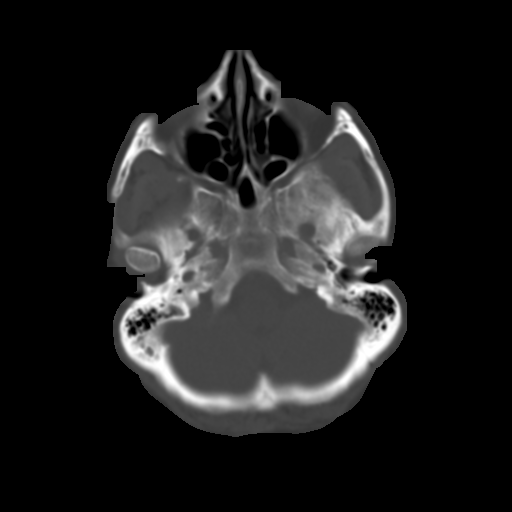
[im 7/30  brain]
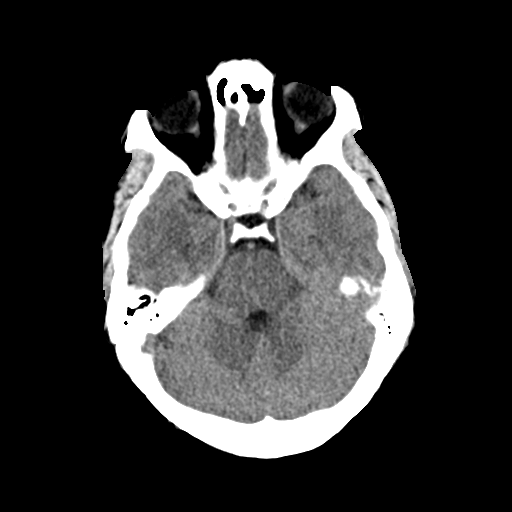
[im 10/30  brain]
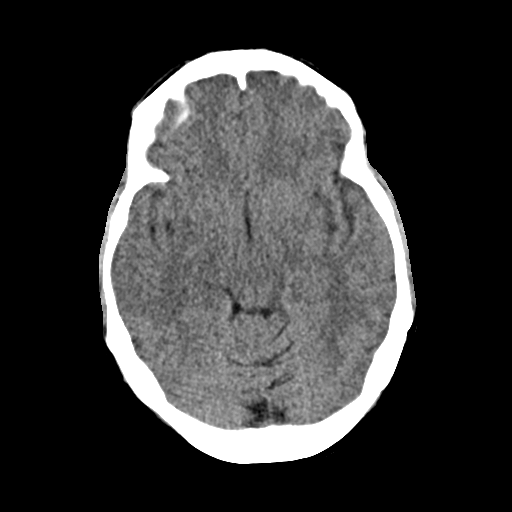
[im 14/30  brain]
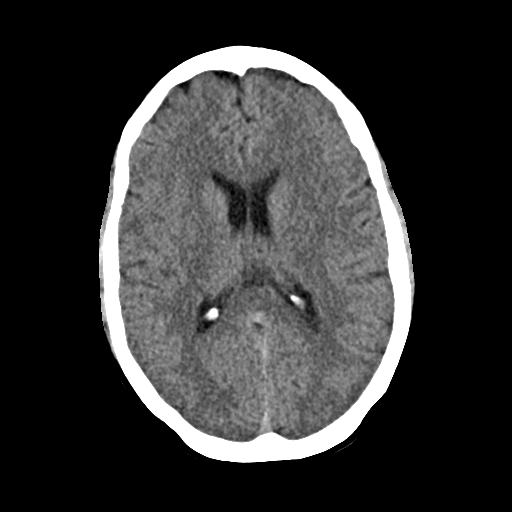
[im 17/30  brain]
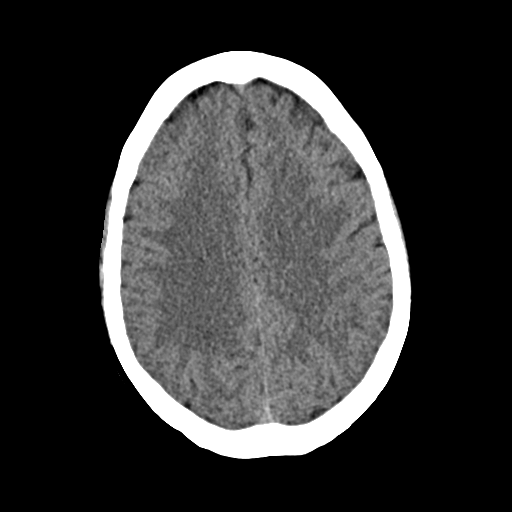
[im 17/30  bone]
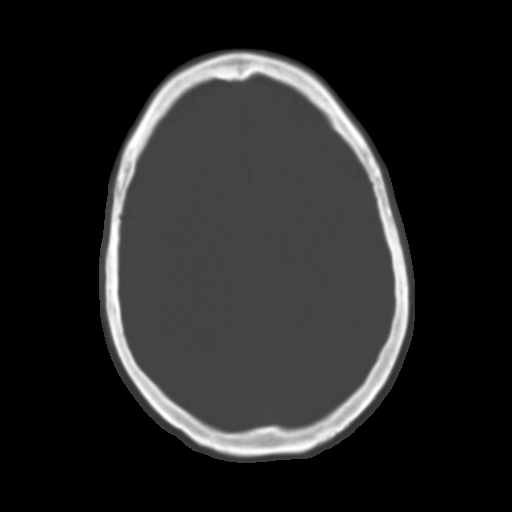
[im 21/30  brain]
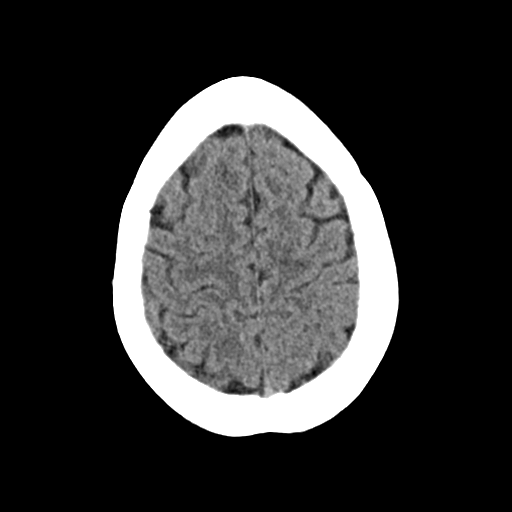
[im 24/30  brain]
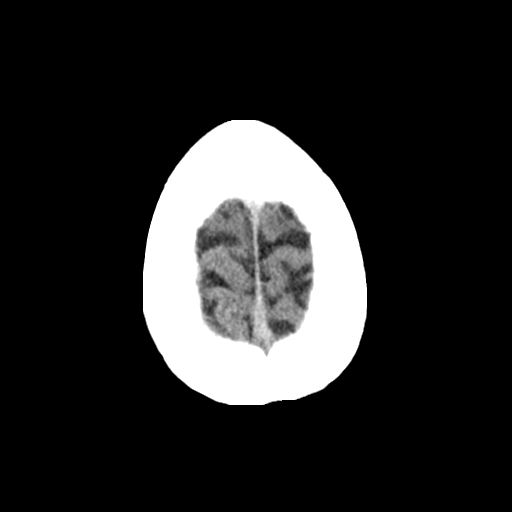
[im 28/30  brain]
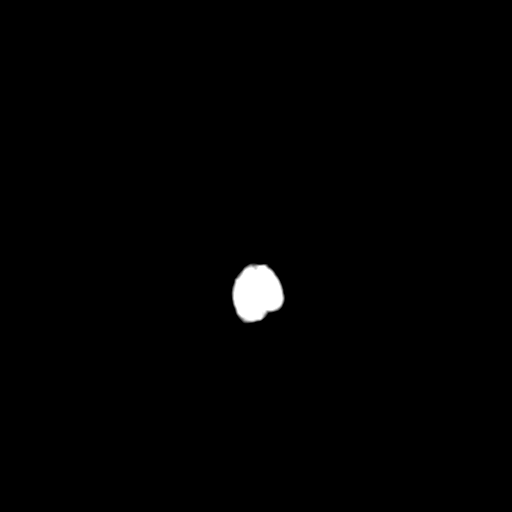

[Series 4: coronal soft · coronal · 0.28mm/px · 3 of 67 slices shown]
[im 23/67  brain]
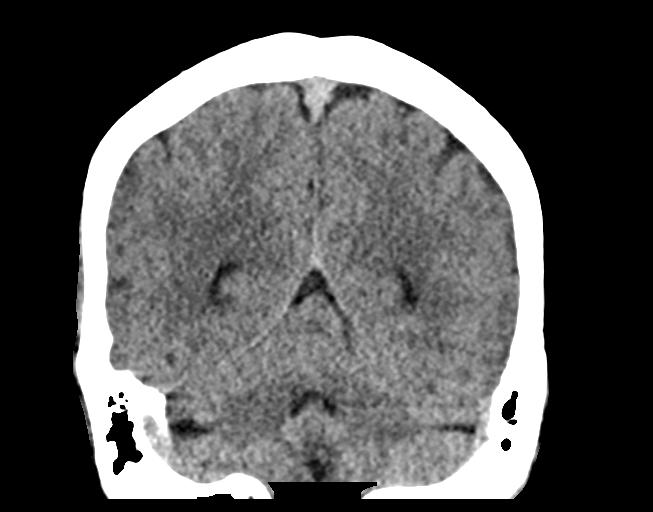
[im 30/67  brain]
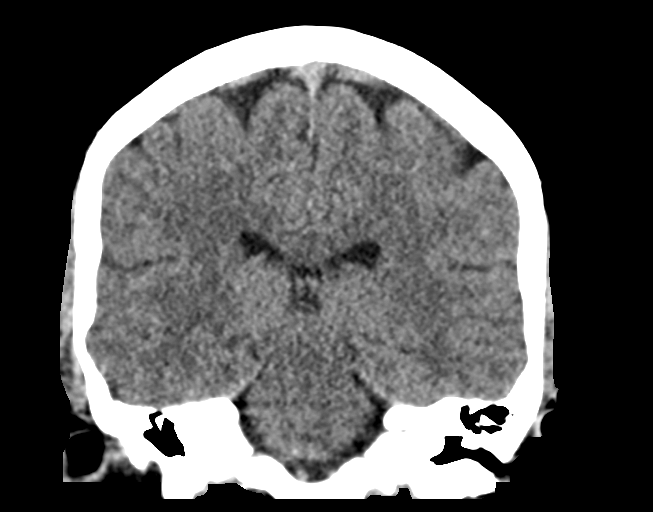
[im 37/67  brain]
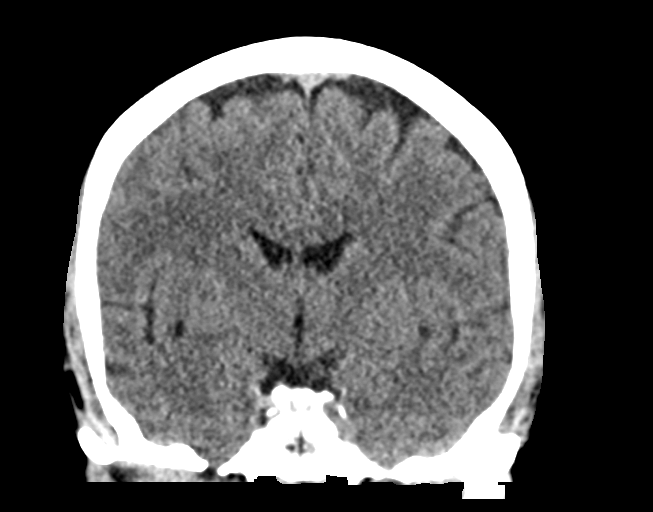

[Series 5: sag soft · sagittal · 0.28mm/px · 3 of 67 slices shown]
[im 23/67  brain]
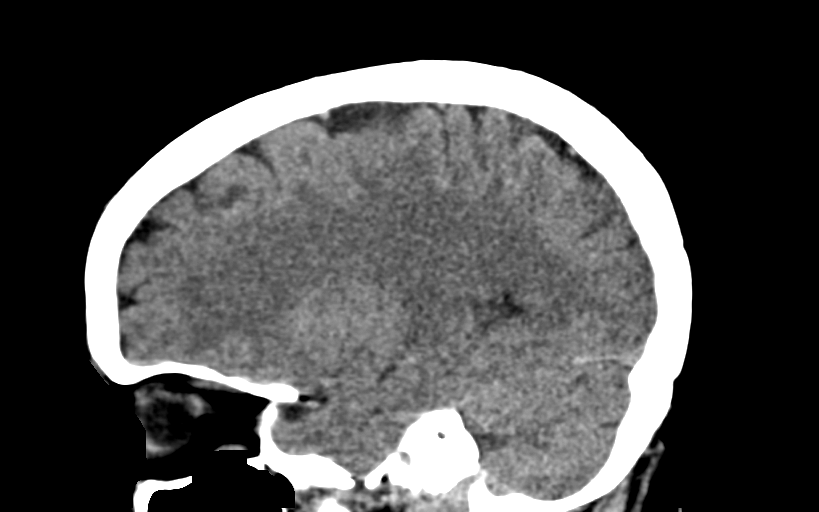
[im 34/67  brain]
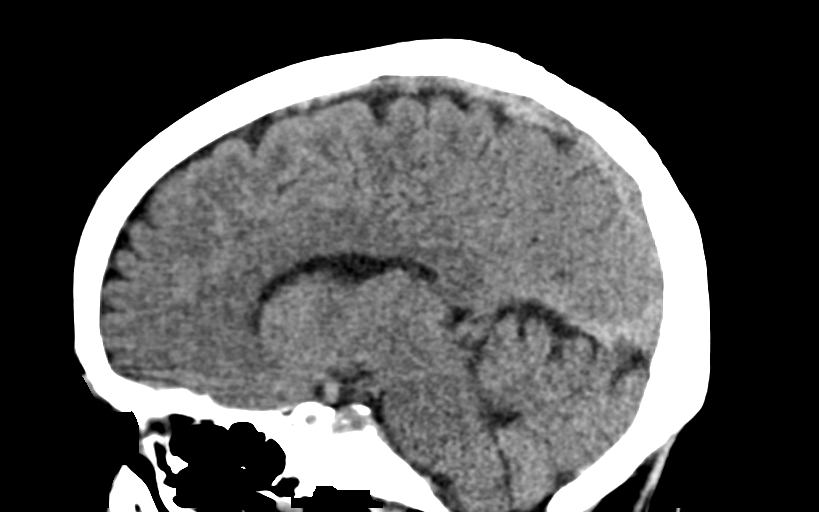
[im 45/67  brain]
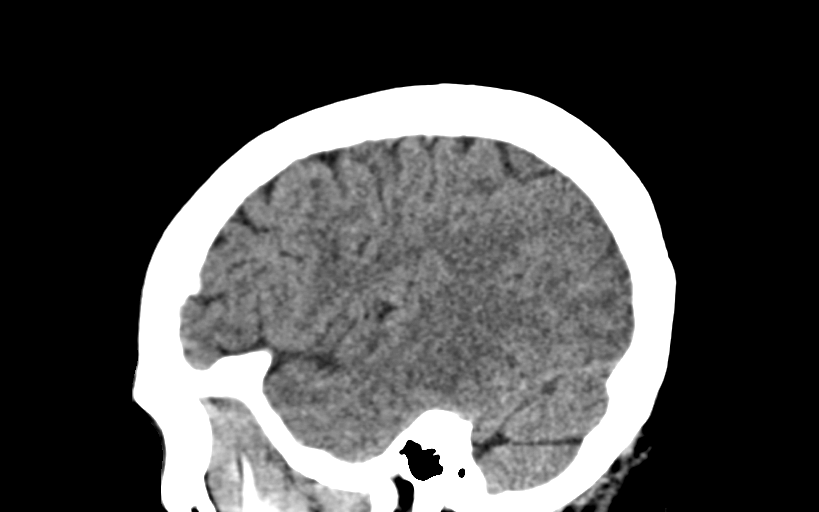

[14 of 47 positions shown; findings below may reference images not displayed]

FINDINGS: Brain: The brain shows a normal appearance without evidence of
malformation, atrophy, old or acute small or large vessel
infarction, mass lesion, hemorrhage, hydrocephalus or extra-axial
collection.

Vascular: No hyperdense vessel. No evidence of atherosclerotic
calcification.

Skull: Normal.  No traumatic finding.  No focal bone lesion.

Sinuses/Orbits: Sinuses are clear. Orbits appear normal. Mastoids
are clear.

Other: None significant
IMPRESSION: Normal head CT.

## 2023-04-11 DIAGNOSIS — D2261 Melanocytic nevi of right upper limb, including shoulder: Secondary | ICD-10-CM | POA: Diagnosis not present

## 2023-04-11 DIAGNOSIS — L821 Other seborrheic keratosis: Secondary | ICD-10-CM | POA: Diagnosis not present

## 2023-04-11 DIAGNOSIS — L570 Actinic keratosis: Secondary | ICD-10-CM | POA: Diagnosis not present

## 2023-04-11 DIAGNOSIS — D225 Melanocytic nevi of trunk: Secondary | ICD-10-CM | POA: Diagnosis not present

## 2023-04-11 DIAGNOSIS — L814 Other melanin hyperpigmentation: Secondary | ICD-10-CM | POA: Diagnosis not present

## 2023-04-14 ENCOUNTER — Other Ambulatory Visit: Payer: Self-pay | Admitting: Family Medicine

## 2023-04-14 DIAGNOSIS — Z1231 Encounter for screening mammogram for malignant neoplasm of breast: Secondary | ICD-10-CM

## 2023-05-01 ENCOUNTER — Ambulatory Visit
Admission: RE | Admit: 2023-05-01 | Discharge: 2023-05-01 | Disposition: A | Discharge status: Home / Self Care | Source: Ambulatory Visit | Attending: Family Medicine | Admitting: Family Medicine

## 2023-05-01 DIAGNOSIS — Z1231 Encounter for screening mammogram for malignant neoplasm of breast: Secondary | ICD-10-CM

## 2023-06-29 DIAGNOSIS — Z0000 Encounter for general adult medical examination without abnormal findings: Secondary | ICD-10-CM | POA: Diagnosis not present

## 2023-06-29 DIAGNOSIS — J4540 Moderate persistent asthma, uncomplicated: Secondary | ICD-10-CM | POA: Diagnosis not present

## 2023-06-29 DIAGNOSIS — Z13228 Encounter for screening for other metabolic disorders: Secondary | ICD-10-CM | POA: Diagnosis not present

## 2023-06-29 DIAGNOSIS — K219 Gastro-esophageal reflux disease without esophagitis: Secondary | ICD-10-CM | POA: Diagnosis not present

## 2023-06-29 DIAGNOSIS — E782 Mixed hyperlipidemia: Secondary | ICD-10-CM | POA: Diagnosis not present

## 2023-06-29 DIAGNOSIS — I10 Essential (primary) hypertension: Secondary | ICD-10-CM | POA: Diagnosis not present

## 2023-08-22 ENCOUNTER — Other Ambulatory Visit (HOSPITAL_BASED_OUTPATIENT_CLINIC_OR_DEPARTMENT_OTHER): Payer: Self-pay

## 2023-08-22 ENCOUNTER — Other Ambulatory Visit (HOSPITAL_COMMUNITY): Payer: Self-pay

## 2023-08-22 MED ORDER — MONTELUKAST SODIUM 10 MG PO TABS
10.0000 mg | ORAL_TABLET | Freq: Every day | ORAL | 4 refills | Status: AC
  Filled 2023-08-22: qty 90, 90d supply, fill #0
  Filled 2023-11-16: qty 90, 90d supply, fill #1
  Filled 2024-02-13: qty 90, 90d supply, fill #2

## 2023-08-25 ENCOUNTER — Other Ambulatory Visit (HOSPITAL_COMMUNITY): Payer: Self-pay

## 2023-09-12 ENCOUNTER — Other Ambulatory Visit: Payer: Self-pay

## 2023-09-12 ENCOUNTER — Other Ambulatory Visit (HOSPITAL_COMMUNITY): Payer: Self-pay

## 2023-09-12 MED ORDER — PANTOPRAZOLE SODIUM 40 MG PO TBEC
40.0000 mg | DELAYED_RELEASE_TABLET | Freq: Two times a day (BID) | ORAL | 4 refills | Status: AC
Start: 2023-09-12 — End: ?
  Filled 2023-09-12: qty 180, 90d supply, fill #0
  Filled 2023-12-06: qty 180, 90d supply, fill #1

## 2023-09-12 MED ORDER — FENOFIBRATE 160 MG PO TABS
160.0000 mg | ORAL_TABLET | Freq: Every day | ORAL | 0 refills | Status: AC
  Filled 2023-09-12: qty 90, 90d supply, fill #0

## 2023-09-12 MED ORDER — CITALOPRAM HYDROBROMIDE 20 MG PO TABS
20.0000 mg | ORAL_TABLET | Freq: Every day | ORAL | 4 refills | Status: AC
  Filled 2023-09-12: qty 90, 90d supply, fill #0

## 2023-09-13 ENCOUNTER — Other Ambulatory Visit (HOSPITAL_COMMUNITY): Payer: Self-pay

## 2023-09-26 DIAGNOSIS — Z0000 Encounter for general adult medical examination without abnormal findings: Secondary | ICD-10-CM | POA: Diagnosis not present

## 2023-09-26 DIAGNOSIS — E782 Mixed hyperlipidemia: Secondary | ICD-10-CM | POA: Diagnosis not present

## 2023-09-26 DIAGNOSIS — K219 Gastro-esophageal reflux disease without esophagitis: Secondary | ICD-10-CM | POA: Diagnosis not present

## 2023-09-26 DIAGNOSIS — J4540 Moderate persistent asthma, uncomplicated: Secondary | ICD-10-CM | POA: Diagnosis not present

## 2023-09-26 DIAGNOSIS — Z1331 Encounter for screening for depression: Secondary | ICD-10-CM | POA: Diagnosis not present

## 2023-09-26 DIAGNOSIS — I10 Essential (primary) hypertension: Secondary | ICD-10-CM | POA: Diagnosis not present

## 2023-09-26 DIAGNOSIS — I499 Cardiac arrhythmia, unspecified: Secondary | ICD-10-CM | POA: Diagnosis not present

## 2023-09-26 DIAGNOSIS — F331 Major depressive disorder, recurrent, moderate: Secondary | ICD-10-CM | POA: Diagnosis not present

## 2023-10-02 ENCOUNTER — Other Ambulatory Visit (HOSPITAL_COMMUNITY): Payer: Self-pay

## 2023-10-02 MED ORDER — COVID-19 MRNA VAC-TRIS(PFIZER) 30 MCG/0.3ML IM SUSY
0.5000 mL | PREFILLED_SYRINGE | Freq: Once | INTRAMUSCULAR | 0 refills | Status: AC
  Filled 2023-10-02: qty 0.5, 1d supply, fill #0

## 2023-11-23 ENCOUNTER — Other Ambulatory Visit (HOSPITAL_COMMUNITY): Payer: Self-pay

## 2023-11-28 ENCOUNTER — Other Ambulatory Visit (HOSPITAL_COMMUNITY): Payer: Self-pay

## 2023-11-28 MED ORDER — VALSARTAN 320 MG PO TABS
320.0000 mg | ORAL_TABLET | Freq: Every day | ORAL | 3 refills | Status: AC
  Filled 2023-11-28: qty 90, 90d supply, fill #0

## 2023-12-15 ENCOUNTER — Other Ambulatory Visit (HOSPITAL_COMMUNITY): Payer: Self-pay

## 2023-12-25 ENCOUNTER — Other Ambulatory Visit (HOSPITAL_COMMUNITY): Payer: Self-pay

## 2023-12-25 MED ORDER — FENOFIBRATE 160 MG PO TABS
160.0000 mg | ORAL_TABLET | Freq: Every day | ORAL | 3 refills | Status: AC
  Filled 2023-12-25: qty 90, 90d supply, fill #0

## 2024-01-09 ENCOUNTER — Other Ambulatory Visit (HOSPITAL_COMMUNITY): Payer: Self-pay

## 2024-01-09 MED ORDER — SCOPOLAMINE 1 MG/3DAYS TD PT72
1.0000 | MEDICATED_PATCH | TRANSDERMAL | 0 refills | Status: AC
  Filled 2024-01-09: qty 4, 12d supply, fill #0
  Filled 2024-01-09: qty 5, 15d supply, fill #0
